# Patient Record
Sex: Male | Born: 1955 | Race: White | Hispanic: No | Marital: Married | Smoking: Never smoker
Health system: Southern US, Community
[De-identification: ages and names within clinical notes are randomized; demographics above are authoritative.]

---

## 2020-01-12 ENCOUNTER — Other Ambulatory Visit: Payer: Self-pay

## 2020-01-12 ENCOUNTER — Emergency Department (HOSPITAL_BASED_OUTPATIENT_CLINIC_OR_DEPARTMENT_OTHER)
Admission: EM | Admit: 2020-01-12 | Discharge: 2020-01-12 | Disposition: A | Discharge status: Home / Self Care | Payer: No Typology Code available for payment source | Attending: Emergency Medicine | Admitting: Emergency Medicine

## 2020-01-12 ENCOUNTER — Emergency Department (HOSPITAL_BASED_OUTPATIENT_CLINIC_OR_DEPARTMENT_OTHER): Payer: No Typology Code available for payment source

## 2020-01-12 ENCOUNTER — Encounter (HOSPITAL_BASED_OUTPATIENT_CLINIC_OR_DEPARTMENT_OTHER): Payer: Self-pay | Admitting: *Deleted

## 2020-01-12 DIAGNOSIS — S199XXA Unspecified injury of neck, initial encounter: Secondary | ICD-10-CM | POA: Diagnosis present

## 2020-01-12 DIAGNOSIS — Y999 Unspecified external cause status: Secondary | ICD-10-CM | POA: Insufficient documentation

## 2020-01-12 DIAGNOSIS — Y9389 Activity, other specified: Secondary | ICD-10-CM | POA: Diagnosis not present

## 2020-01-12 DIAGNOSIS — Y92410 Unspecified street and highway as the place of occurrence of the external cause: Secondary | ICD-10-CM | POA: Diagnosis not present

## 2020-01-12 DIAGNOSIS — S161XXA Strain of muscle, fascia and tendon at neck level, initial encounter: Secondary | ICD-10-CM | POA: Diagnosis not present

## 2020-01-12 MED ORDER — NAPROXEN 500 MG PO TABS: 500.0000 mg | ORAL_TABLET | Freq: Two times a day (BID) | ORAL | 0 refills | Status: AC | PRN

## 2020-01-12 MED ORDER — CYCLOBENZAPRINE HCL 10 MG PO TABS: 10.0000 mg | ORAL_TABLET | Freq: Two times a day (BID) | ORAL | 0 refills | Status: AC | PRN

## 2020-01-12 MED FILL — NAPROXEN 500 MG TABS: 500 | 15 days supply | Qty: 30 | Fill #0

## 2020-01-12 MED FILL — CYCLOBENZAPRINE HCL 10 MG T: 10 | 10 days supply | Qty: 20 | Fill #0

## 2020-01-12 NOTE — ED Notes (Signed)
Pt transported to XR.  

## 2020-01-12 NOTE — Discharge Instructions (Signed)
Recommend taking prescribed anti-inflammatories as well as muscle relaxer as needed.  Note the muscle relaxer can make you drowsy and should not be taken with driving or operating heavy machinery.  Return to ER if you develop chest pain, difficulty breathing, worsening neck pain, any numbness, weakness or other new concerning symptom.
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
